# Patient Record
Sex: Male | Born: 1985 | Hispanic: No | Marital: Single | Smoking: Never smoker
Health system: Southern US, Community
[De-identification: ages and names within clinical notes are randomized; demographics above are authoritative.]

## PROBLEM LIST (undated history)

## (undated) DIAGNOSIS — T79A0XA Compartment syndrome, unspecified, initial encounter: Secondary | ICD-10-CM

## (undated) DIAGNOSIS — G4733 Obstructive sleep apnea (adult) (pediatric): Secondary | ICD-10-CM

## (undated) DIAGNOSIS — F4310 Post-traumatic stress disorder, unspecified: Secondary | ICD-10-CM

## (undated) HISTORY — PX: NO PAST SURGERIES: SHX2092

## (undated) HISTORY — DX: Post-traumatic stress disorder, unspecified: F43.10

## (undated) HISTORY — DX: Obstructive sleep apnea (adult) (pediatric): G47.33

## (undated) HISTORY — DX: Compartment syndrome, unspecified, initial encounter: T79.A0XA

---

## 2004-10-07 ENCOUNTER — Emergency Department (HOSPITAL_COMMUNITY): Admission: EM | Admit: 2004-10-07 | Discharge: 2004-10-07 | Payer: Self-pay | Admitting: Family Medicine

## 2011-01-19 ENCOUNTER — Other Ambulatory Visit (HOSPITAL_COMMUNITY): Payer: Self-pay | Admitting: Orthopedic Surgery

## 2011-01-19 DIAGNOSIS — R52 Pain, unspecified: Secondary | ICD-10-CM

## 2011-01-22 ENCOUNTER — Inpatient Hospital Stay (HOSPITAL_COMMUNITY): Admission: RE | Admit: 2011-01-22 | Payer: Self-pay | Source: Ambulatory Visit

## 2011-01-22 ENCOUNTER — Ambulatory Visit (HOSPITAL_COMMUNITY)
Admission: RE | Admit: 2011-01-22 | Discharge: 2011-01-22 | Disposition: A | Discharge status: Home / Self Care | Source: Ambulatory Visit | Attending: Orthopedic Surgery | Admitting: Orthopedic Surgery

## 2011-01-22 ENCOUNTER — Other Ambulatory Visit (HOSPITAL_COMMUNITY): Payer: Self-pay | Admitting: Orthopedic Surgery

## 2011-01-22 DIAGNOSIS — M79609 Pain in unspecified limb: Secondary | ICD-10-CM | POA: Insufficient documentation

## 2011-01-22 DIAGNOSIS — R52 Pain, unspecified: Secondary | ICD-10-CM

## 2011-01-22 DIAGNOSIS — IMO0002 Reserved for concepts with insufficient information to code with codable children: Secondary | ICD-10-CM

## 2013-05-03 ENCOUNTER — Emergency Department (HOSPITAL_COMMUNITY)
Admission: EM | Admit: 2013-05-03 | Discharge: 2013-05-04 | Disposition: A | Discharge status: Home / Self Care | Payer: BC Managed Care – PPO | Attending: Emergency Medicine | Admitting: Emergency Medicine

## 2013-05-03 ENCOUNTER — Encounter (HOSPITAL_COMMUNITY): Payer: Self-pay | Admitting: *Deleted

## 2013-05-03 DIAGNOSIS — R112 Nausea with vomiting, unspecified: Secondary | ICD-10-CM | POA: Insufficient documentation

## 2013-05-03 DIAGNOSIS — R51 Headache: Secondary | ICD-10-CM | POA: Insufficient documentation

## 2013-05-03 DIAGNOSIS — Y939 Activity, unspecified: Secondary | ICD-10-CM | POA: Insufficient documentation

## 2013-05-03 DIAGNOSIS — Y929 Unspecified place or not applicable: Secondary | ICD-10-CM | POA: Insufficient documentation

## 2013-05-03 DIAGNOSIS — X30XXXA Exposure to excessive natural heat, initial encounter: Secondary | ICD-10-CM | POA: Insufficient documentation

## 2013-05-03 DIAGNOSIS — T679XXA Effect of heat and light, unspecified, initial encounter: Secondary | ICD-10-CM | POA: Insufficient documentation

## 2013-05-03 DIAGNOSIS — R509 Fever, unspecified: Secondary | ICD-10-CM | POA: Insufficient documentation

## 2013-05-03 NOTE — ED Notes (Signed)
Pt c/o headache, n/v, and fever. 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 Pt concerned about heat exposure. Fever has fluctuated today.

## 2013-05-04 ENCOUNTER — Emergency Department (HOSPITAL_COMMUNITY): Payer: BC Managed Care – PPO

## 2013-05-04 LAB — CBC WITH DIFFERENTIAL/PLATELET
Eosinophils Absolute: 0 10*3/uL (ref 0.0–0.7)
Eosinophils Relative: 0 % (ref 0–5)
Lymphs Abs: 0.5 10*3/uL — ABNORMAL LOW (ref 0.7–4.0)
MCH: 30.6 pg (ref 26.0–34.0)
MCV: 89.1 fL (ref 78.0–100.0)
Monocytes Absolute: 1 10*3/uL (ref 0.1–1.0)
Platelets: 294 10*3/uL (ref 150–400)
RBC: 4.68 MIL/uL (ref 4.22–5.81)
RDW: 12.8 % (ref 11.5–15.5)

## 2013-05-04 LAB — BASIC METABOLIC PANEL
Calcium: 9.7 mg/dL (ref 8.4–10.5)
Creatinine, Ser: 0.93 mg/dL (ref 0.50–1.35)
GFR calc non Af Amer: >90 mL/min (ref >90)
Glucose, Bld: 127 mg/dL — ABNORMAL HIGH (ref 70–99)
Sodium: 132 mEq/L — ABNORMAL LOW (ref 135–145)

## 2013-05-04 MED ORDER — MORPHINE SULFATE 4 MG/ML IJ SOLN
2.0000 mg | Freq: Once | INTRAMUSCULAR | Status: AC
  Administered 2013-05-04: 2 mg via INTRAVENOUS
  Filled 2013-05-04: qty 1

## 2013-05-04 MED ORDER — SODIUM CHLORIDE 0.9 % IV BOLUS (SEPSIS)
1000.0000 mL | Freq: Once | INTRAVENOUS | Status: AC
  Administered 2013-05-04: 1000 mL via INTRAVENOUS

## 2013-05-04 MED ORDER — ONDANSETRON HCL 4 MG/2ML IJ SOLN
4.0000 mg | Freq: Once | INTRAMUSCULAR | Status: AC
  Administered 2013-05-04: 4 mg via INTRAVENOUS
  Filled 2013-05-04: qty 2

## 2013-05-04 MED ORDER — KETOROLAC TROMETHAMINE 30 MG/ML IJ SOLN
30.0000 mg | Freq: Once | INTRAMUSCULAR | Status: AC
  Administered 2013-05-04: 30 mg via INTRAVENOUS
  Filled 2013-05-04: qty 1

## 2013-05-04 MED ORDER — SODIUM CHLORIDE 0.9 % IV BOLUS (SEPSIS): 1000.0000 mL | Freq: Once | INTRAVENOUS | Status: DC

## 2013-05-04 MED ORDER — SODIUM CHLORIDE 0.9 % IV SOLN
Freq: Once | INTRAVENOUS | Status: AC
  Administered 2013-05-04: via INTRAVENOUS

## 2013-05-04 NOTE — ED Provider Notes (Signed)
History    CSN: [id number] Arrival date & time 05/03/13  2253  First MD Initiated Contact with Patient 05/04/13 0000     Chief Complaint  Patient presents with  . Fever  . Emesis  . Headache  . Heat Exposure   (Consider location/radiation/quality/duration/timing/severity/associated sxs/prior Treatment) HPI HPI Comments: Joshua Pope is a 27 y.o. male who presents to the Emergency Department complaining of headache, nausea, vomiting and a fever. He is in the [employer] and has been outside in uniform working in the heat. Denies chills, shortness of breath, cough. He has taken no pills today.   PCP Dr. Gerda Diss   History reviewed. No pertinent past medical history. History reviewed. No pertinent past surgical history. History reviewed. No pertinent family history. History  Substance Use Topics  . Smoking status: Never Smoker   . Smokeless tobacco: Not on file  . Alcohol Use: No    Review of Systems  Constitutional: Positive for fever.       10 Systems reviewed and are negative for acute change except as noted in the HPI.  HENT: Negative for congestion.   Eyes: Negative for discharge and redness.  Respiratory: Negative for cough and shortness of breath.   Cardiovascular: Negative for chest pain.  Gastrointestinal: Positive for nausea and vomiting. Negative for abdominal pain.  Musculoskeletal: Negative for back pain.  Skin: Negative for rash.  Neurological: Positive for headaches. Negative for syncope and numbness.  Psychiatric/Behavioral:       No behavior change.    Allergies  Review of patient's allergies indicates no known allergies.  Home Medications  No current outpatient prescriptions on file. BP 131/68  Pulse 116  Temp(Src) 102.7 F (39.3 C) (Oral)  Resp 24  Ht 5\' 10"  (1.778 m)  Wt 270 lb (122.471 kg)  BMI 38.74 kg/m2  SpO2 98% Physical Exam  Nursing note and vitals reviewed. Constitutional: He appears well-developed and well-nourished.  Awake, alert,  nontoxic appearance.  HENT:  Head: Normocephalic and atraumatic.  Eyes: EOM are normal. Pupils are equal, round, and reactive to light. Right eye exhibits no discharge. Left eye exhibits no discharge.  Neck: Neck supple.  Cardiovascular:  tachycardia  Pulmonary/Chest: Effort normal. He exhibits no tenderness.  Abdominal: Soft. There is no tenderness. There is no rebound.  Musculoskeletal: He exhibits no tenderness.  Baseline ROM, no obvious new focal weakness.  Neurological:  Mental status and motor strength appears baseline for patient and situation.  Skin: No rash noted.  Psychiatric: He has a normal mood and affect.    ED Course  Procedures (including critical care time) Results for orders placed during the hospital encounter of 05/03/13  CBC WITH DIFFERENTIAL      Result Value Range   WBC 13.5 (*) 4.0 - 10.5 K/uL   RBC 4.68  4.22 - 5.81 MIL/uL   Hemoglobin 14.3  13.0 - 17.0 g/dL   HCT 01.0  27.2 - 53.6 %   MCV 89.1  78.0 - 100.0 fL   MCH 30.6  26.0 - 34.0 pg   MCHC 34.3  30.0 - 36.0 g/dL   RDW 64.4  03.4 - 74.2 %   Platelets 294  150 - 400 K/uL   Neutrophils Relative % 88 (*) 43 - 77 %   Neutro Abs 11.9 (*) 1.7 - 7.7 K/uL   Lymphocytes Relative 4 (*) 12 - 46 %   Lymphs Abs 0.5 (*) 0.7 - 4.0 K/uL   Monocytes Relative 8  3 - 12 %  Monocytes Absolute 1.0  0.1 - 1.0 K/uL   Eosinophils Relative 0  0 - 5 %   Eosinophils Absolute 0.0  0.0 - 0.7 K/uL   Basophils Relative 0  0 - 1 %   Basophils Absolute 0.0  0.0 - 0.1 K/uL  BASIC METABOLIC PANEL      Result Value Range   Sodium 132 (*) 135 - 145 mEq/L   Potassium 3.3 (*) 3.5 - 5.1 mEq/L   Chloride 95 (*) 96 - 112 mEq/L   CO2 26  19 - 32 mEq/L   Glucose, Bld 127 (*) 70 - 99 mg/dL   BUN 10  6 - 23 mg/dL   Creatinine, Ser 1.61  0.50 - 1.35 mg/dL   Calcium 9.7  8.4 - 09.6 mg/dL   GFR calc non Af Amer >90  >90 mL/min   GFR calc Af Amer >90  >90 mL/min  CK      Result Value Range   Total CK 189  7 - 232 U/L   Dg Chest 2  View  05/04/2013   *RADIOLOGY REPORT*  Clinical Data: Headache, nausea, vomiting and fever.  CHEST - 2 VIEW  Comparison: None.  Findings: The lungs are well-aerated.  Pulmonary vascularity is at the upper limits of normal.  There is no evidence of focal opacification, pleural effusion or pneumothorax.  The heart is normal in size; the mediastinal contour is within normal limits.  No acute osseous abnormalities are seen.  IMPRESSION: No acute cardiopulmonary process seen.   Original Report Authenticated By: Tonia Ghent, M.D.   Medications  sodium chloride 0.9 % bolus 1,000 mL (not administered)  0.9 %  sodium chloride infusion ( Intravenous Stopped 05/04/13 0522)  sodium chloride 0.9 % bolus 1,000 mL (0 mLs Intravenous Stopped 05/04/13 0117)  ketorolac (TORADOL) 30 MG/ML injection 30 mg (30 mg Intravenous Given 05/04/13 0116)  ondansetron (ZOFRAN) injection 4 mg (4 mg Intravenous Given 05/04/13 0116)  sodium chloride 0.9 % bolus 1,000 mL (0 mLs Intravenous Stopped 05/04/13 0333)  morphine 4 MG/ML injection 2 mg (2 mg Intravenous Given 05/04/13 0210)     0534 Patient has received three liters of IVF. He has gone to the bathroom x 2. HR has come down from 116 to 101. Temperature has come down to 99. He feels better.  MDM  Patient with fever to 102.7 and feeling tired dehydrated. He had been working outside in full uniform. Given IVF x 3. Patient has improved over time. Pt stable in ED with no significant deterioration in condition.The patient appears reasonably screened and/or stabilized for discharge and I doubt any other medical condition or other [facility] requiring further screening, evaluation, or treatment in the ED at this time prior to discharge.  MDM Reviewed: nursing note and vitals Interpretation: labs and x-ray     Nicoletta Dress. Colon Branch, MD 05/04/13 [phone]

## 2013-05-06 ENCOUNTER — Ambulatory Visit (INDEPENDENT_AMBULATORY_CARE_PROVIDER_SITE_OTHER): Payer: BC Managed Care – PPO | Admitting: Family Medicine

## 2013-05-06 ENCOUNTER — Encounter: Payer: Self-pay | Admitting: Family Medicine

## 2013-05-06 VITALS — BP 122/82 | Temp 98.4°F | Wt 292.0 lb

## 2013-05-06 DIAGNOSIS — J039 Acute tonsillitis, unspecified: Secondary | ICD-10-CM

## 2013-05-06 MED ORDER — AZITHROMYCIN 250 MG PO TABS: ORAL_TABLET | ORAL | Status: DC

## 2013-05-06 NOTE — Progress Notes (Signed)
  Subjective:    Patient ID: Elber Galyean, male    DOB: 07/21/1986, 27 y.o.   MRN: [id number]  Fever    All day sat and sun in the heat and humidity. Started running fever.  Not sweating as well.   Tried to hydrate, developed fever and vominting. Ended u in er with dx of heat exhaustion.  Seen in ER   Review of Systems  Constitutional: Positive for fever.   no chest pain no abdominal pain fair appetite mild headache ROS otherwise negative     Objective:   Physical Exam  Alert no acute distress. Lungs clear. Heart regular rate and rhythm. H&T pharynx erythematous exam date of tonsillitis tender anterior nodes.      Assessment & Plan:  Impression exudative tonsillitis with elements of heat exhaustion. Plan Z-Pak. Symptomatic care discussed. Avoiding dehydration discussed. I really think most of patient's symptoms were his incipient sickness rather than heat exhaustion. WSL

## 2013-07-17 ENCOUNTER — Other Ambulatory Visit (HOSPITAL_COMMUNITY)

## 2013-07-17 ENCOUNTER — Emergency Department (HOSPITAL_COMMUNITY): Payer: Worker's Compensation

## 2013-07-17 ENCOUNTER — Encounter (HOSPITAL_COMMUNITY): Payer: Self-pay

## 2013-07-17 ENCOUNTER — Emergency Department (HOSPITAL_COMMUNITY)
Admission: EM | Admit: 2013-07-17 | Discharge: 2013-07-17 | Disposition: A | Discharge status: Home / Self Care | Payer: Worker's Compensation | Attending: Emergency Medicine | Admitting: Emergency Medicine

## 2013-07-17 DIAGNOSIS — Y990 Civilian activity done for income or pay: Secondary | ICD-10-CM | POA: Insufficient documentation

## 2013-07-17 DIAGNOSIS — IMO0002 Reserved for concepts with insufficient information to code with codable children: Secondary | ICD-10-CM | POA: Insufficient documentation

## 2013-07-17 DIAGNOSIS — S060X1A Concussion with loss of consciousness of 30 minutes or less, initial encounter: Secondary | ICD-10-CM | POA: Insufficient documentation

## 2013-07-17 DIAGNOSIS — Y9389 Activity, other specified: Secondary | ICD-10-CM | POA: Insufficient documentation

## 2013-07-17 DIAGNOSIS — Y9289 Other specified places as the place of occurrence of the external cause: Secondary | ICD-10-CM | POA: Insufficient documentation

## 2013-07-17 DIAGNOSIS — S060X9A Concussion with loss of consciousness of unspecified duration, initial encounter: Secondary | ICD-10-CM

## 2013-07-17 MED ORDER — IBUPROFEN 800 MG PO TABS: 800.0000 mg | ORAL_TABLET | Freq: Three times a day (TID) | ORAL | Status: DC | PRN

## 2013-07-17 MED ORDER — IBUPROFEN 800 MG PO TABS
800.0000 mg | ORAL_TABLET | Freq: Once | ORAL | Status: AC
  Administered 2013-07-17: 800 mg via ORAL
  Filled 2013-07-17: qty 1

## 2013-07-17 NOTE — ED Provider Notes (Signed)
CSN: [id number]     Arrival date & time 07/17/13  1052 History   First MD Initiated Contact with Patient 07/17/13 1101     Chief Complaint  Patient presents with  . Head Injury   (Consider location/radiation/quality/duration/timing/severity/associated sxs/prior Treatment) HPI Comments: Patient here from working at [employer] when he was helping unload a piece of heavy equipment that weighted 2500 pounds when it became off balance and stuck him on the left parietal side of the head.  + LOC for approximately 2-3 minutes.  Reports now with headache and scalp tenderness to the area.  Denies nausea, vomiting, blurred vision, seizure, neck or back pain, dizziness.  Patient with abrasions to the scalp at the location without frank laceration.  Patient is a 27 y.o. male presenting with head injury. The history is provided by the patient. No language interpreter was used.  Head Injury Location:  L parietal Time since incident:  1 hour Mechanism of injury: direct blow and work   Pain details:    Quality:  Throbbing   Severity:  Moderate   Duration:  1 hour   Timing:  Constant   Progression:  Unchanged Chronicity:  New Relieved by:  Nothing Worsened by:  Nothing tried Ineffective treatments:  None tried Associated symptoms: headache and loss of consciousness   Associated symptoms: no blurred vision, no disorientation, no double vision, no focal weakness, no hearing loss, no memory loss, no nausea, no neck pain, no numbness, no seizures and no vomiting     History reviewed. No pertinent past medical history. History reviewed. No pertinent past surgical history. No family history on file. History  Substance Use Topics  . Smoking status: Never Smoker   . Smokeless tobacco: Never Used  . Alcohol Use: No    Review of Systems  HENT: Negative for hearing loss and neck pain.   Eyes: Negative for blurred vision and double vision.  Gastrointestinal: Negative for nausea and  vomiting.  Neurological: Positive for loss of consciousness and headaches. Negative for focal weakness, seizures and numbness.  Psychiatric/Behavioral: Negative for memory loss.  All other systems reviewed and are negative.    Allergies  Review of patient's allergies indicates no known allergies.  Home Medications  No current outpatient prescriptions on file. BP 124/76  Pulse 82  Temp(Src) 98.7 F (37.1 C) (Oral)  Resp 16  Wt 288 lb 5.8 oz (130.8 kg)  BMI 41.38 kg/m2  SpO2 99% Physical Exam  Nursing note and vitals reviewed. Constitutional: He is oriented to person, place, and time. He appears well-developed and well-nourished. No distress.  HENT:  Head: Normocephalic. Head is without raccoon's eyes, without Battle's sign and without laceration.    Right Ear: External ear normal. No hemotympanum.  Left Ear: External ear normal. No hemotympanum.  Nose: Nose normal.  Mouth/Throat: Oropharynx is clear and moist. No oropharyngeal exudate.  Eyes: Conjunctivae and EOM are normal. Pupils are equal, round, and reactive to light. No scleral icterus.  No facial tenderness  Neck: Neck supple. No spinous process tenderness and no muscular tenderness present.  Cardiovascular: Normal rate, regular rhythm and normal heart sounds.  Exam reveals no gallop and no friction rub.   No murmur heard. Pulmonary/Chest: Effort normal and breath sounds normal. No respiratory distress. He has no wheezes. He has no rales. He exhibits no tenderness.  Abdominal: Soft. Bowel sounds are normal. He exhibits no distension and no mass. There is no tenderness. There is no rebound and no guarding.  Musculoskeletal:  Normal range of motion. He exhibits no edema and no tenderness.  Neurological: He is alert and oriented to person, place, and time. No cranial nerve deficit. He exhibits normal muscle tone. Coordination normal.  Skin: Skin is warm and dry. No rash noted. No erythema. No pallor.  Psychiatric: He has a  normal mood and affect. His behavior is normal. Judgment and thought content normal.    ED Course  Procedures (including critical care time) Labs Review Labs Reviewed - No data to display Imaging Review Ct Head Wo Contrast  07/17/2013   CLINICAL DATA:  The patient was involved any in industrial accent. He was struck in the left parietal region and knocked to the ground. The patient lost consciousness.  EXAM: CT HEAD WITHOUT CONTRAST  CT CERVICAL SPINE WITHOUT CONTRAST  TECHNIQUE: Multidetector CT imaging of the head and cervical spine was performed following the standard protocol without intravenous contrast. Multiplanar CT image reconstructions of the cervical spine were also generated.  COMPARISON:  None.  FINDINGS: CT HEAD FINDINGS  No acute cortical infarct, hemorrhage, or mass lesion is present toes. Ventricles are of normal size. No significant extra-axial fluid collection is present.  A polyp or mucous retention cyst is evident in the left maxillary sinus. The paranasal sinuses and mastoid air cells are clear. The osseous skull is intact. No significant extracranial soft tissue injury is evident.  CT CERVICAL SPINE FINDINGS  The cervical spine is visualized from the skullbase through T1-2. There is loss of bone detail at C7-T1 secondary to patient body habitus. Vertebral body heights and alignment are maintained. There is calcification in the anterior disk at C5-6, a normal variant. No acute fracture or traumatic subluxation is evident. There straightening of the normal cervical lordosis, likely positional as the patient is in a hard collar.  IMPRESSION: CT HEAD IMPRESSION  1. Negative CT of the head.  CT CERVICAL SPINE IMPRESSION  1. No acute fracture or traumatic subluxation 2. Straightening and slight reversal of the normal cervical lordosis is likely positional as the patient is in a hard collar.   Electronically Signed   By: Gennette Pac   On: 07/17/2013 12:07   Ct Cervical Spine Wo  Contrast  07/17/2013   CLINICAL DATA:  The patient was involved any in industrial accent. He was struck in the left parietal region and knocked to the ground. The patient lost consciousness.  EXAM: CT HEAD WITHOUT CONTRAST  CT CERVICAL SPINE WITHOUT CONTRAST  TECHNIQUE: Multidetector CT imaging of the head and cervical spine was performed following the standard protocol without intravenous contrast. Multiplanar CT image reconstructions of the cervical spine were also generated.  COMPARISON:  None.  FINDINGS: CT HEAD FINDINGS  No acute cortical infarct, hemorrhage, or mass lesion is present toes. Ventricles are of normal size. No significant extra-axial fluid collection is present.  A polyp or mucous retention cyst is evident in the left maxillary sinus. The paranasal sinuses and mastoid air cells are clear. The osseous skull is intact. No significant extracranial soft tissue injury is evident.  CT CERVICAL SPINE FINDINGS  The cervical spine is visualized from the skullbase through T1-2. There is loss of bone detail at C7-T1 secondary to patient body habitus. Vertebral body heights and alignment are maintained. There is calcification in the anterior disk at C5-6, a normal variant. No acute fracture or traumatic subluxation is evident. There straightening of the normal cervical lordosis, likely positional as the patient is in a hard collar.  IMPRESSION: CT HEAD  IMPRESSION  1. Negative CT of the head.  CT CERVICAL SPINE IMPRESSION  1. No acute fracture or traumatic subluxation 2. Straightening and slight reversal of the normal cervical lordosis is likely positional as the patient is in a hard collar.   Electronically Signed   By: Gennette Pac   On: 07/17/2013 12:07    MDM  Concussion with brief LOC  Patient here with head injury with + LOC, no other injuries noted.  Stable while in ER.  Remembers entire event besides during the LOC.  No laceration.  Stable for discharge and home.   Izola Price Marisue Humble,  PA-C 07/17/13 1241

## 2013-07-17 NOTE — ED Notes (Signed)
Patient denies any neck or back pain. Logroll of LSB by nursing staff. No cervical, thoracic, lumbar or sacral tenderness. Head of bed raised for patient comfort. C-collar remains in place.

## 2013-07-17 NOTE — ED Notes (Signed)
Patient was a work at [employer] via [employer]. 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 Patient knocked to ground, positive LOC.

## 2013-07-19 NOTE — ED Provider Notes (Signed)
History/physical exam/procedure(s) were performed by non-physician practitioner and as supervising physician I was immediately available for consultation/collaboration. I have reviewed all notes and am in agreement with care and plan.   Leightyn Cina S Demian Maisel, MD 07/19/13 1625 

## 2013-09-05 ENCOUNTER — Encounter (HOSPITAL_COMMUNITY): Payer: Self-pay | Admitting: Emergency Medicine

## 2013-09-05 ENCOUNTER — Emergency Department (HOSPITAL_COMMUNITY): Payer: BC Managed Care – PPO

## 2013-09-05 ENCOUNTER — Emergency Department (HOSPITAL_COMMUNITY)
Admission: EM | Admit: 2013-09-05 | Discharge: 2013-09-05 | Disposition: A | Discharge status: Home / Self Care | Payer: BC Managed Care – PPO | Attending: Emergency Medicine | Admitting: Emergency Medicine

## 2013-09-05 DIAGNOSIS — M545 Low back pain, unspecified: Secondary | ICD-10-CM | POA: Insufficient documentation

## 2013-09-05 DIAGNOSIS — Z792 Long term (current) use of antibiotics: Secondary | ICD-10-CM | POA: Insufficient documentation

## 2013-09-05 DIAGNOSIS — J40 Bronchitis, not specified as acute or chronic: Secondary | ICD-10-CM | POA: Insufficient documentation

## 2013-09-05 DIAGNOSIS — K5900 Constipation, unspecified: Secondary | ICD-10-CM

## 2013-09-05 DIAGNOSIS — M549 Dorsalgia, unspecified: Secondary | ICD-10-CM

## 2013-09-05 DIAGNOSIS — Z8619 Personal history of other infectious and parasitic diseases: Secondary | ICD-10-CM | POA: Insufficient documentation

## 2013-09-05 LAB — URINALYSIS, ROUTINE W REFLEX MICROSCOPIC
Bilirubin Urine: NEGATIVE
Glucose, UA: NEGATIVE mg/dL
Hgb urine dipstick: NEGATIVE
Ketones, ur: NEGATIVE mg/dL
Nitrite: NEGATIVE
pH: 6 (ref 5.0–8.0)

## 2013-09-05 MED ORDER — DEXAMETHASONE 6 MG PO TABS: ORAL_TABLET | ORAL | Status: DC

## 2013-09-05 MED ORDER — MAGNESIUM HYDROXIDE 400 MG/5ML PO SUSP
30.0000 mL | Freq: Once | ORAL | Status: AC
  Administered 2013-09-05: 30 mL via ORAL
  Filled 2013-09-05: qty 30

## 2013-09-05 MED ORDER — DICLOFENAC SODIUM 75 MG PO TBEC: 75.0000 mg | DELAYED_RELEASE_TABLET | Freq: Two times a day (BID) | ORAL | Status: DC

## 2013-09-05 MED ORDER — METHOCARBAMOL 500 MG PO TABS: 500.0000 mg | ORAL_TABLET | Freq: Three times a day (TID) | ORAL | Status: DC

## 2013-09-05 MED ORDER — HYDROCODONE-ACETAMINOPHEN 5-325 MG PO TABS
2.0000 | ORAL_TABLET | Freq: Once | ORAL | Status: AC
  Administered 2013-09-05: 2 via ORAL
  Filled 2013-09-05: qty 2

## 2013-09-05 MED ORDER — ONDANSETRON HCL 4 MG PO TABS
4.0000 mg | ORAL_TABLET | Freq: Once | ORAL | Status: AC
  Administered 2013-09-05: 4 mg via ORAL
  Filled 2013-09-05: qty 1

## 2013-09-05 NOTE — ED Notes (Signed)
Pain in right lower back, worse with movement, nauseated yesterday, diagnosed with strep last week

## 2013-09-05 NOTE — ED Provider Notes (Signed)
CSN: [id number]     Arrival date & time 09/05/13  1704 History   First MD Initiated Contact with Patient 09/05/13 1816     Chief Complaint  Patient presents with  . Back Pain   (Consider location/radiation/quality/duration/timing/severity/associated sxs/prior Treatment) Patient is a 27 y.o. male presenting with back pain. The history is provided by the patient.  Back Pain Location:  Lumbar spine Quality:  Aching Radiates to:  Does not radiate Pain severity:  Moderate Pain is:  Same all the time Onset quality:  Gradual Duration:  1 day Timing:  Constant Progression:  Worsening Chronicity:  New Context comment:  Coughing a lot Relieved by:  Nothing Worsened by:  Movement and deep breathing Ineffective treatments:  None tried Associated symptoms: no abdominal pain, no bladder incontinence, no bowel incontinence, no chest pain, no dysuria, no numbness and no perianal numbness   Risk factors comment:  Treated for strep over 1 week ago. Multiple family members have been sick iwth uri   History reviewed. No pertinent past medical history. History reviewed. No pertinent past surgical history. No family history on file. History  Substance Use Topics  . Smoking status: Never Smoker   . Smokeless tobacco: Never Used  . Alcohol Use: No    Review of Systems  Constitutional: Negative for activity change.       All ROS Neg except as noted in HPI  HENT: Negative for nosebleeds.   Eyes: Negative for photophobia and discharge.  Respiratory: Negative for cough, shortness of breath and wheezing.   Cardiovascular: Negative for chest pain and palpitations.  Gastrointestinal: Negative for abdominal pain, blood in stool and bowel incontinence.  Genitourinary: Negative for bladder incontinence, dysuria, frequency and hematuria.  Musculoskeletal: Positive for back pain. Negative for arthralgias and neck pain.  Skin: Negative.   Neurological: Negative for dizziness, seizures, speech difficulty  and numbness.  Psychiatric/Behavioral: Negative for hallucinations and confusion.    Allergies  Review of patient's allergies indicates no known allergies.  Home Medications   Current Outpatient Rx  Name  Route  Sig  Dispense  Refill  . amoxicillin (AMOXIL) 875 MG tablet   Oral   Take 875 mg by mouth 2 (two) times daily. 10 DAY COURSE STARTING ON 08/21/2013          BP 141/90  Pulse 102  Ht 5\' 10"  (1.778 m)  Wt 270 lb (122.471 kg)  BMI 38.74 kg/m2  SpO2 100% Physical Exam  Nursing note and vitals reviewed. Constitutional: He is oriented to person, place, and time. He appears well-developed and well-nourished.  Non-toxic appearance.  HENT:  Head: Normocephalic.  Right Ear: Tympanic membrane and external ear normal.  Left Ear: Tympanic membrane and external ear normal.  Eyes: EOM and lids are normal. Pupils are equal, round, and reactive to light.  Neck: Normal range of motion. Neck supple. Carotid bruit is not present.  Cardiovascular: Normal rate, regular rhythm, normal heart sounds, intact distal pulses and normal pulses.   Pulmonary/Chest: Breath sounds normal. No respiratory distress.  Abdominal: Soft. Bowel sounds are normal. There is no tenderness. There is no guarding.  No CVA tenderness.  Musculoskeletal:  There is pain with attempted range of motion of the lower lumbar area. There is soreness at the paraspinous area of the lumbar region. There no hot areas appreciated. No palpable step off.  Lymphadenopathy:       Head (right side): No submandibular adenopathy present.       Head (left side): No submandibular  adenopathy present.    He has no cervical adenopathy.  Neurological: He is alert and oriented to person, place, and time. He has normal strength. No cranial nerve deficit or sensory deficit.  Skin: Skin is warm and dry.  Psychiatric: He has a normal mood and affect. His speech is normal.    ED Course  Procedures (including critical care time) Labs  Review Labs Reviewed  ANTISTREPTOLYSIN O TITER  URINALYSIS, ROUTINE W REFLEX MICROSCOPIC   Imaging Review No results found.  EKG Interpretation   None       MDM  No diagnosis found. **I have reviewed nursing notes, vital signs, and all appropriate lab and imaging results for this patient.* Urinalysis is negative for urinary tract infection or evidence of kidney stone. Chest x-ray shows mild bronchitic changes without acute cardiopulmonary disease. Lumbar spine film shows no acute finding. There is colonic stool burden with mild to moderate gaseous distention of the small bowel region.  Patient recently treated for strep, but did not finish his antibiotics as prescribed. A SO titer has been obtained. The plan at this time is for the patient to increase bran and leafy green vegetables and fluids. Patient advised to use stool softener if needed. We'll also treat with diclofenac and Robaxin and prednisone.  Kathie Dike, PA-C 09/05/13 2143  Kathie Dike, PA-C 09/05/13 [phone]

## 2013-09-06 NOTE — ED Provider Notes (Signed)
Medical screening examination/treatment/procedure(s) were performed by non-physician practitioner and as supervising physician I was immediately available for consultation/collaboration.  EKG Interpretation   None         Laray Anger, DO 09/06/13 7829

## 2013-09-09 LAB — ANTISTREPTOLYSIN O TITER: ASO: 1570 IU/mL — ABNORMAL HIGH (ref <409)

## 2014-01-15 IMAGING — CR DG CHEST 2V
2 series · 2 of 2 positions shown · non-contrast
Comparison: 05/04/2013

CLINICAL DATA: Right lower back pain, worse with movement

EXAM:
CHEST  2 VIEW

[view not recorded (1 of 2)]
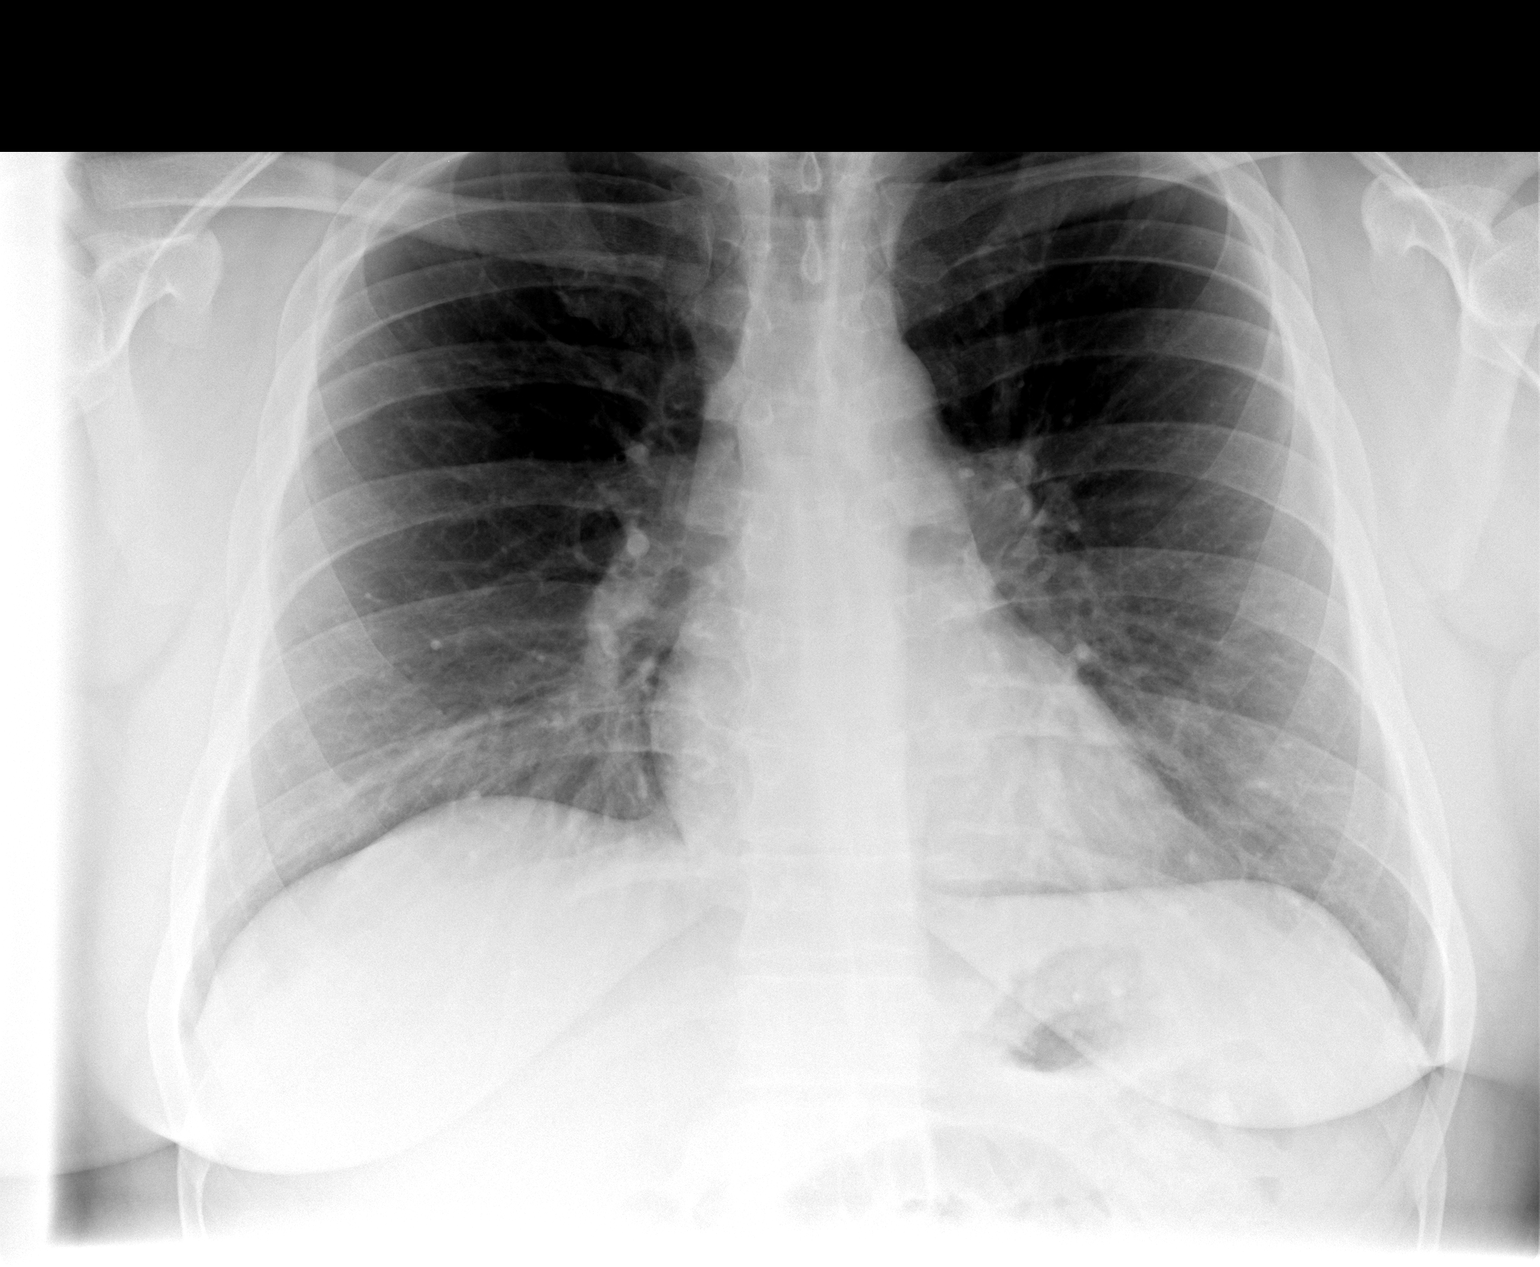

[view not recorded (2 of 2)]
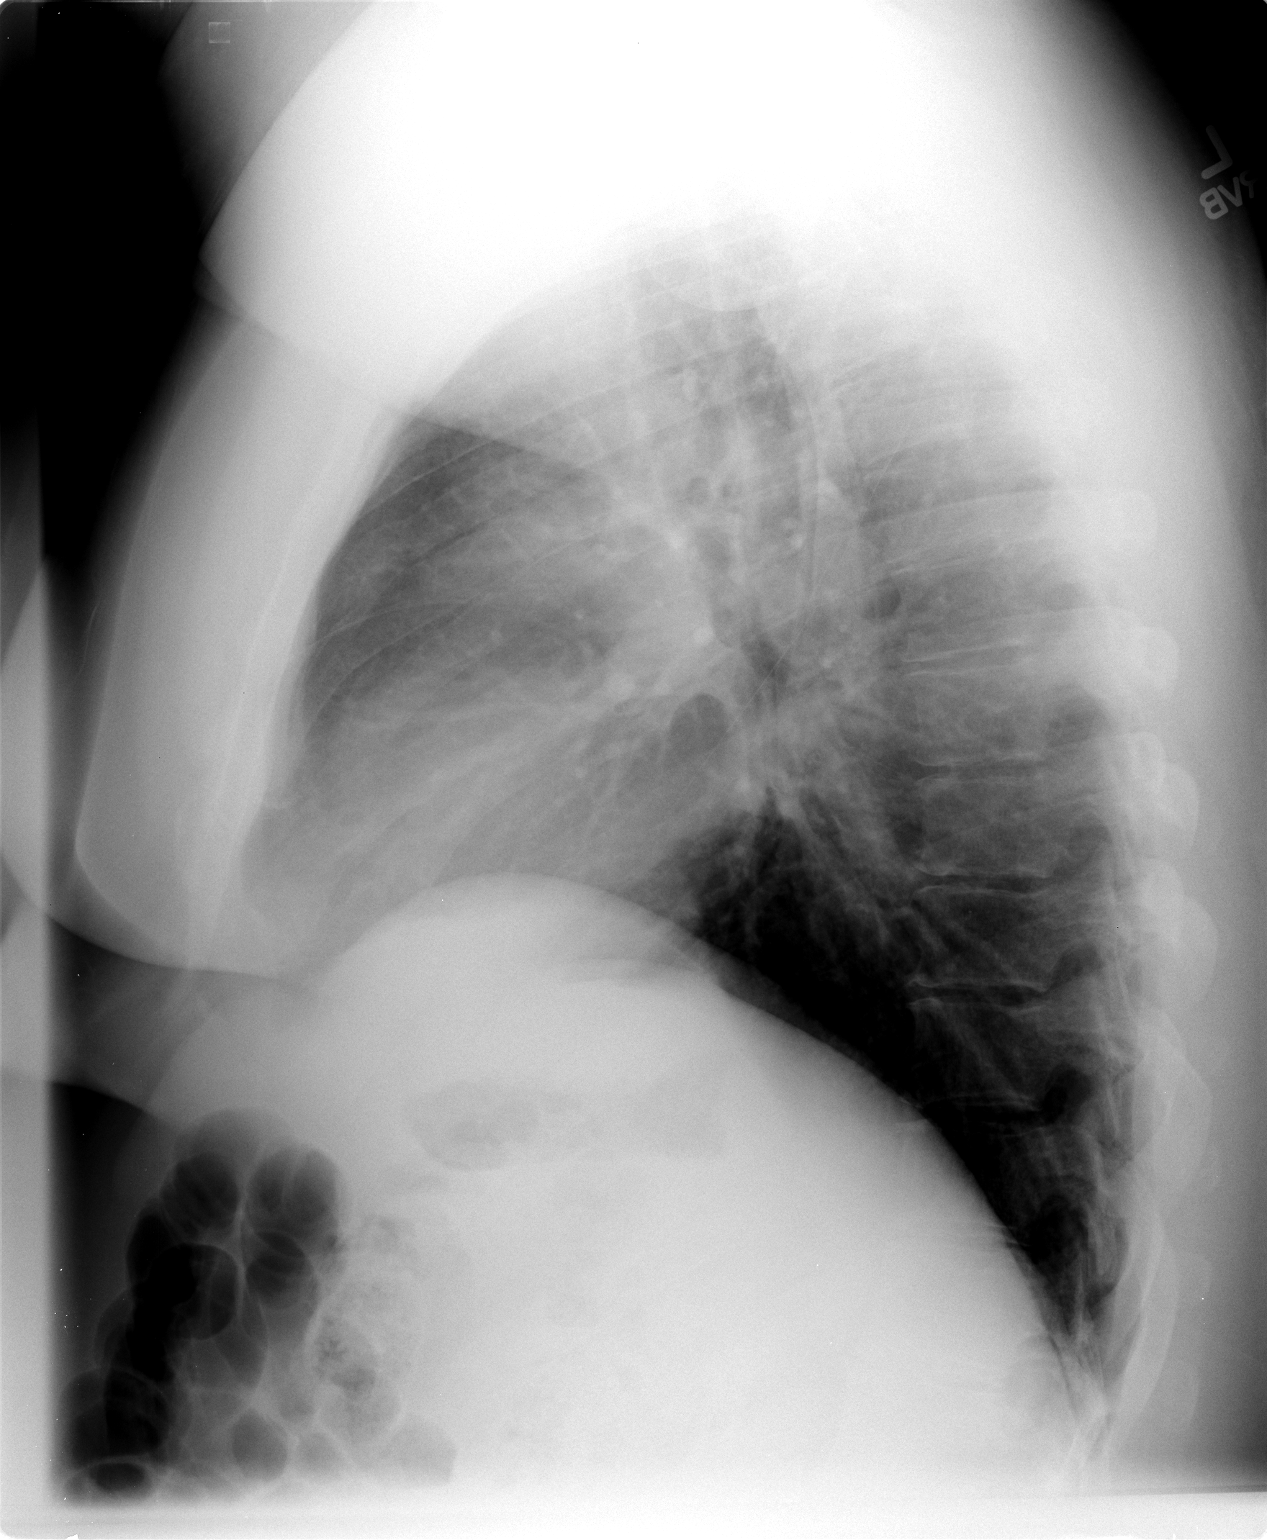

[2 of 2 positions shown; findings below may reference images not displayed]

FINDINGS: Grossly unchanged cardiac silhouette and mediastinal contours. This
grossly unchanged mild thickening of the pulmonary interstitium. No
focal airspace opacities. Mild elevation the right hemidiaphragm. No
pleural effusion or pneumothorax. No evidence of edema.
IMPRESSION: Mild bronchitic change without acute cardiopulmonary disease.

## 2018-10-02 ENCOUNTER — Other Ambulatory Visit: Payer: Self-pay

## 2018-10-02 ENCOUNTER — Emergency Department (HOSPITAL_COMMUNITY): Payer: Worker's Compensation

## 2018-10-02 ENCOUNTER — Emergency Department (HOSPITAL_COMMUNITY)
Admission: EM | Admit: 2018-10-02 | Discharge: 2018-10-02 | Disposition: A | Discharge status: Home / Self Care | Payer: Worker's Compensation | Attending: Emergency Medicine | Admitting: Emergency Medicine

## 2018-10-02 ENCOUNTER — Encounter (HOSPITAL_COMMUNITY): Payer: Self-pay | Admitting: Emergency Medicine

## 2018-10-02 DIAGNOSIS — Z23 Encounter for immunization: Secondary | ICD-10-CM | POA: Diagnosis not present

## 2018-10-02 DIAGNOSIS — W268XXA Contact with other sharp object(s), not elsewhere classified, initial encounter: Secondary | ICD-10-CM | POA: Insufficient documentation

## 2018-10-02 DIAGNOSIS — Y929 Unspecified place or not applicable: Secondary | ICD-10-CM | POA: Diagnosis not present

## 2018-10-02 DIAGNOSIS — Y999 Unspecified external cause status: Secondary | ICD-10-CM | POA: Insufficient documentation

## 2018-10-02 DIAGNOSIS — S61412A Laceration without foreign body of left hand, initial encounter: Secondary | ICD-10-CM | POA: Diagnosis present

## 2018-10-02 DIAGNOSIS — S61211A Laceration without foreign body of left index finger without damage to nail, initial encounter: Secondary | ICD-10-CM | POA: Insufficient documentation

## 2018-10-02 DIAGNOSIS — Y939 Activity, unspecified: Secondary | ICD-10-CM | POA: Insufficient documentation

## 2018-10-02 MED ORDER — POVIDONE-IODINE 10 % EX SOLN
CUTANEOUS | Status: AC
  Administered 2018-10-02: 17:00:00
  Filled 2018-10-02: qty 15

## 2018-10-02 MED ORDER — TETANUS-DIPHTH-ACELL PERTUSSIS 5-2.5-18.5 LF-MCG/0.5 IM SUSP
0.5000 mL | Freq: Once | INTRAMUSCULAR | Status: AC
  Administered 2018-10-02: 0.5 mL via INTRAMUSCULAR
  Filled 2018-10-02: qty 0.5

## 2018-10-02 MED ORDER — LIDOCAINE HCL (PF) 1 % IJ SOLN
INTRAMUSCULAR | Status: AC
  Administered 2018-10-02: 5 mL
  Filled 2018-10-02: qty 5

## 2018-10-02 NOTE — ED Triage Notes (Signed)
PT states his left hand index finger obtained a laceration with bleeding controlled a this time when a metal motor part fell onto it today at work.

## 2018-10-02 NOTE — ED Provider Notes (Signed)
[facility] EMERGENCY DEPARTMENT Provider Note   CSN: [id number] Arrival date & time: 10/02/18  1557     History   Chief Complaint Chief Complaint  Patient presents with  . Laceration    HPI Joshua Pope is a 32 y.o. male.  The history is provided by the patient.  Laceration   The incident occurred less than 1 hour ago. The laceration is located on the left hand. The laceration is 2 cm in size. The laceration mechanism was a a metal edge. The pain is at a severity of 2/10. The pain is mild. The pain has been constant since onset. He reports no foreign bodies present. His tetanus status is out of date.    History reviewed. No pertinent past medical history.  There are no active problems to display for this patient.   History reviewed. No pertinent surgical history.      Home Medications    Prior to Admission medications   Medication Sig Start Date End Date Taking? Authorizing Provider  amoxicillin (AMOXIL) 875 MG tablet Take 875 mg by mouth 2 (two) times daily. 10 DAY COURSE STARTING ON 08/21/2013    [provider]  dexamethasone (DECADRON) 6 MG tablet 1 po bid  With food 09/05/13   Ivery Quale, PA-C  diclofenac (VOLTAREN) 75 MG EC tablet Take 1 tablet (75 mg total) by mouth 2 (two) times daily. 09/05/13   Ivery Quale, PA-C  methocarbamol (ROBAXIN) 500 MG tablet Take 1 tablet (500 mg total) by mouth 3 (three) times daily. 09/05/13   Ivery Quale, PA-C    Family History History reviewed. No pertinent family history.  Social History Social History   Tobacco Use  . Smoking status: Never Smoker  . Smokeless tobacco: Never Used  Substance Use Topics  . Alcohol use: No  . Drug use: No     Allergies   Patient has no known allergies.   Review of Systems Review of Systems  Constitutional: Negative for chills and fever.  Respiratory: Negative.   Cardiovascular: Negative.   Gastrointestinal: Negative.   Musculoskeletal: Negative.   Skin:  Positive for wound.  Neurological: Negative for weakness and numbness.     Physical Exam Updated Vital Signs BP (!) 136/93 (BP Location: Right Arm)   Pulse 82   Temp 98.3 F (36.8 C) (Oral)   Resp 19   Ht 5\' 9"  (1.753 m)   Wt 131.5 kg   SpO2 99%   BMI 42.83 kg/m   Physical Exam Constitutional:      Appearance: He is well-developed.  HENT:     Head: Normocephalic.  Cardiovascular:     Rate and Rhythm: Normal rate.  Pulmonary:     Effort: Pulmonary effort is normal.  Musculoskeletal:        General: Tenderness present.  Skin:    Findings: Laceration present.  Neurological:     Mental Status: He is alert and oriented to person, place, and time.     Sensory: No sensory deficit.      ED Treatments / Results  Labs (all labs ordered are listed, but only abnormal results are displayed) Labs Reviewed - No data to display  EKG None  Radiology Dg Hand Complete Left  Result Date: 10/02/2018 CLINICAL DATA:  Metal object fell on hand EXAM: LEFT HAND - COMPLETE 3+ VIEW COMPARISON:  None. FINDINGS: Frontal, oblique, and lateral views obtained. No radiopaque foreign body identified beyond ring overlying proximal fourth phalanx. There is no appreciable soft tissue air radiographically.  There is no fracture or dislocation. Joint spaces appear normal. No erosive change. IMPRESSION: No fracture or dislocation. No appreciable arthropathy. No radiopaque foreign body beyond ring overlying fourth proximal digit. Electronically Signed   By: Bretta BangWilliam  Woodruff III M.D.   On: 10/02/2018 17:11    Procedures Procedures (including critical care time)  LACERATION REPAIR Performed by: Burgess AmorJulie Petronella Shuford Authorized by: Burgess AmorJulie Ardella Chhim Consent: Verbal consent obtained. Risks and benefits: risks, benefits and alternatives were discussed Consent given by: patient Patient identity confirmed: provided demographic data Prepped and Draped in normal sterile fashion Wound explored  Laceration Location: left  volar index finger across the dip joint  Laceration Length: 2cm  No Foreign Bodies seen or palpated, no deep structures visualized  Anesthesia: digital block Local anesthetic: lidocaine 1% without epinephrine  Anesthetic total: 2 ml  Irrigation method: syringe Amount of cleaning: copious after betadine wash, scrubbing with 4x4's and saline.   Skin closure: ethilon 4-0  Number of sutures: 5  Technique: simple interupted  Patient tolerance: Patient tolerated the procedure well with no immediate complications.   Medications Ordered in ED Medications  Tdap (BOOSTRIX) injection 0.5 mL (0.5 mLs Intramuscular Given 10/02/18 1638)  povidone-iodine (BETADINE) 10 % external solution (  Given 10/02/18 1640)  lidocaine (PF) (XYLOCAINE) 1 % injection (5 mLs  Given 10/02/18 1640)     Initial Impression / Assessment and Plan / ED Course  I have reviewed the triage vital signs and the nursing notes.  Pertinent labs & imaging results that were available during my care of the patient were reviewed by me and considered in my medical decision making (see chart for details).     Wound care instructions given.  Pt advised to have sutures removed in 10 days,  Return here sooner for any signs of infection including redness, swelling, worse pain or drainage of pus.     Final Clinical Impressions(s) / ED Diagnoses   Final diagnoses:  Laceration of left index finger without foreign body without damage to nail, initial encounter    ED Discharge Orders    None       Victoriano Laindol, Krue Peterka, PA-C 10/02/18 1746    Sabas SousBero, Michael M, MD 10/03/18 [phone]

## 2018-10-02 NOTE — Discharge Instructions (Addendum)
Keep your wound clean and dry.  Have your sutures removed in 10 days.    Get rechecked for any sign of infection (redness,  Swelling,  Increased pain or drainage of purulent fluid).

## 2019-02-11 IMAGING — DX DG HAND COMPLETE 3+V*L*
3 series · 3 of 3 positions shown · non-contrast
Comparison: None.

CLINICAL DATA: Metal object fell on hand

EXAM:
LEFT HAND - COMPLETE 3+ VIEW

[hand pa]
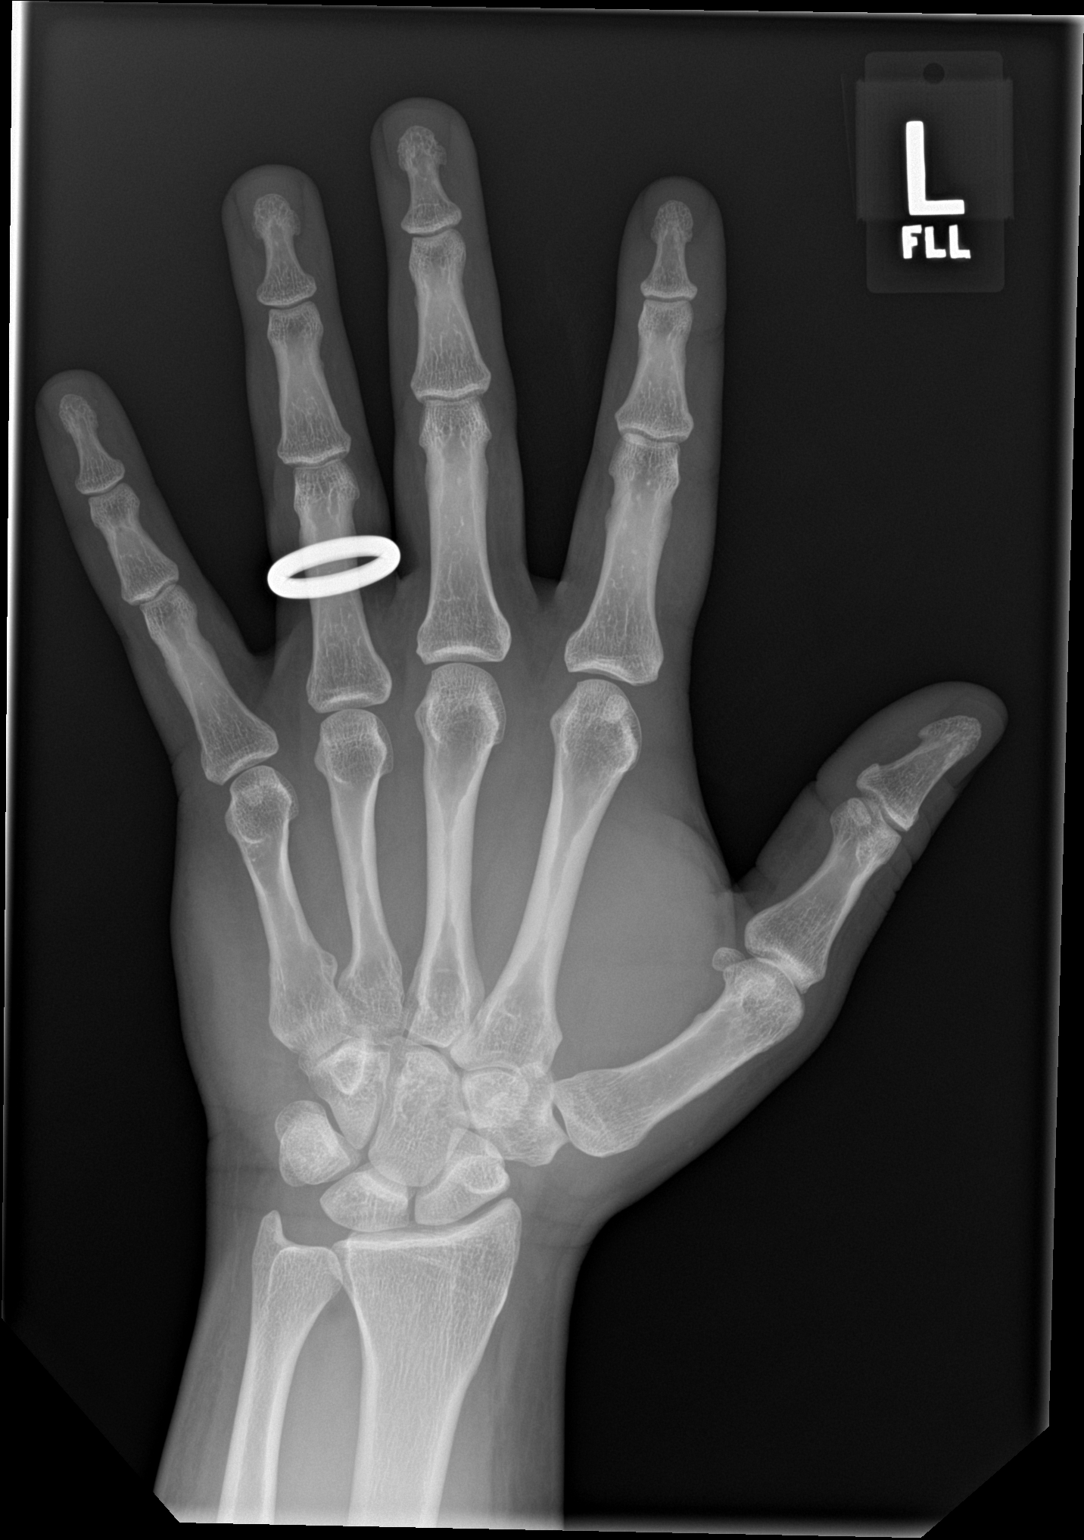

[hand obl]
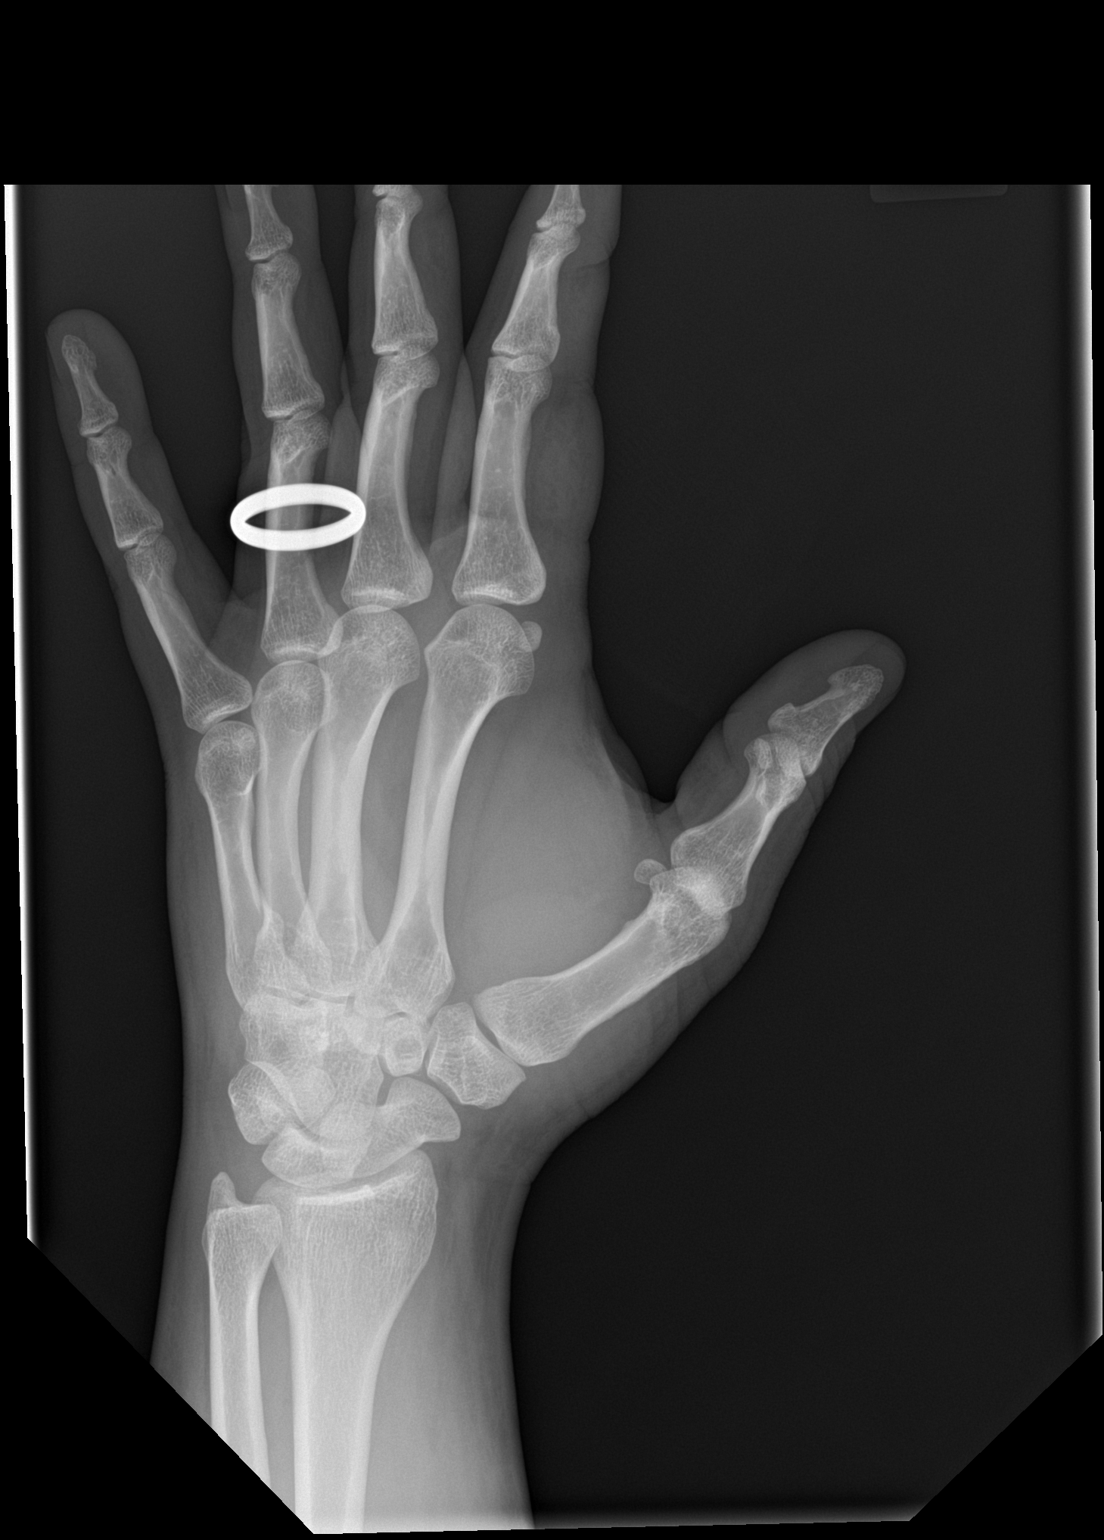

[hand lat]
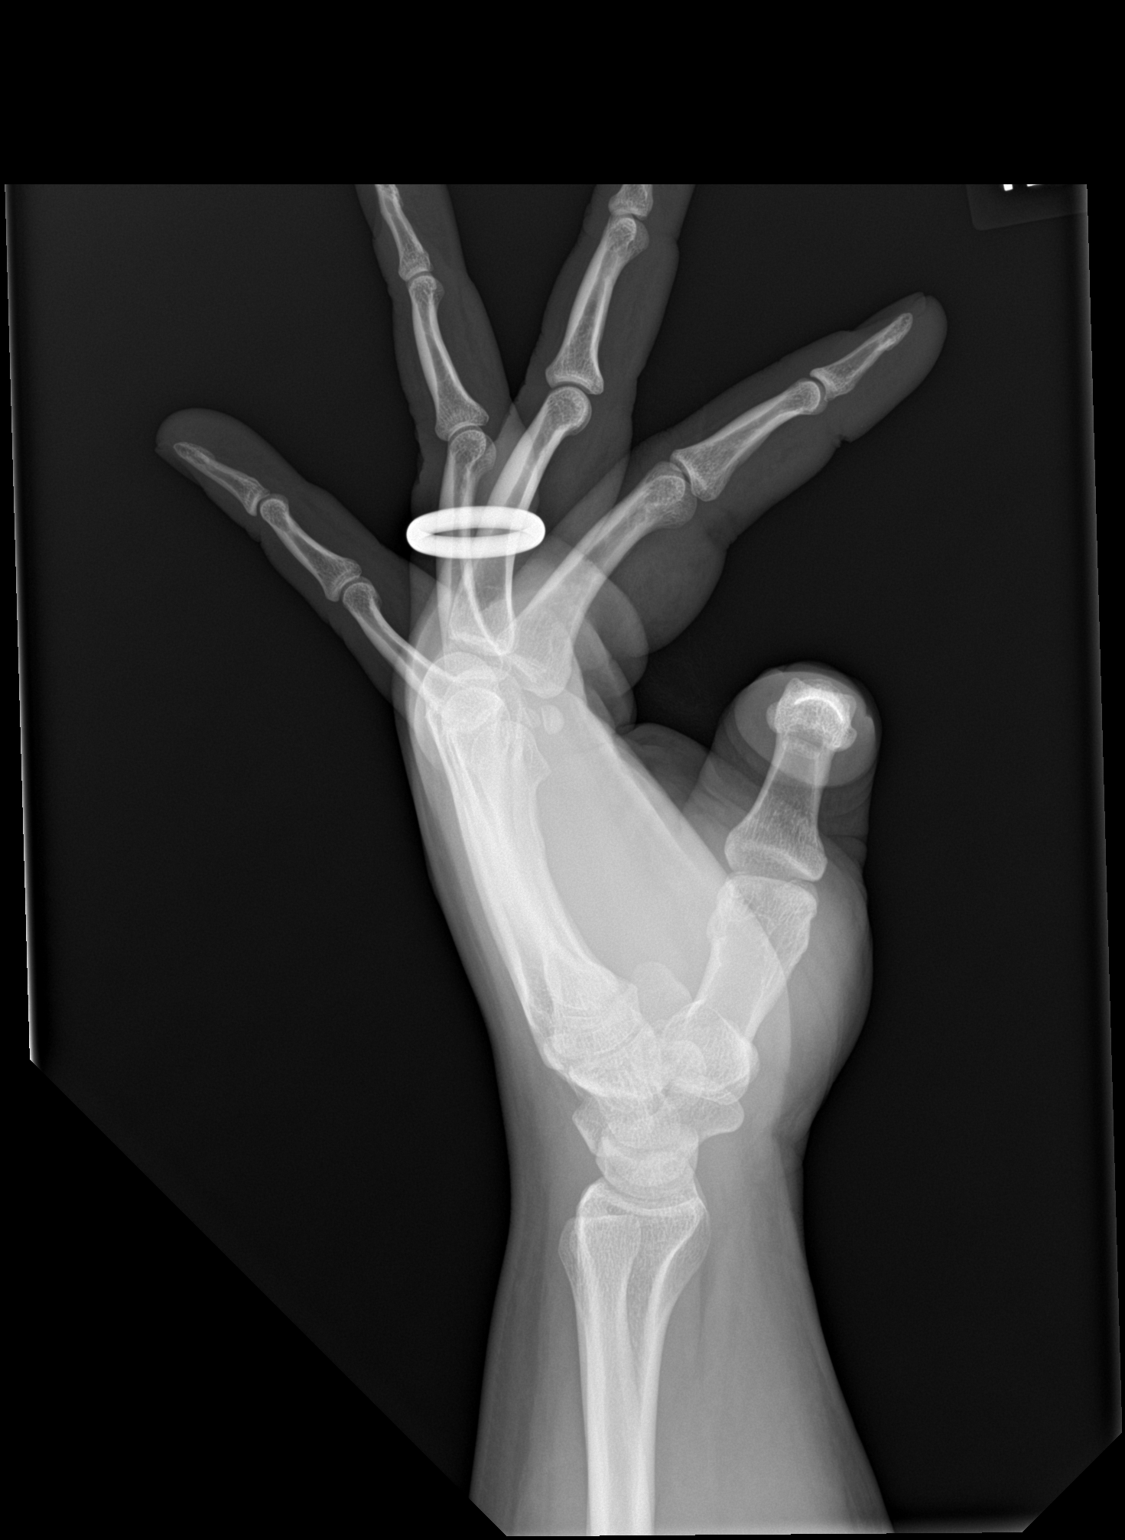

[3 of 3 positions shown; findings below may reference images not displayed]

FINDINGS: Frontal, oblique, and lateral views obtained. No radiopaque foreign
body identified beyond ring overlying proximal fourth phalanx. There
is no appreciable soft tissue air radiographically. There is no
fracture or dislocation. Joint spaces appear normal. No erosive
change.
IMPRESSION: No fracture or dislocation. No appreciable arthropathy. No
radiopaque foreign body beyond ring overlying fourth proximal digit.

## 2019-11-23 ENCOUNTER — Encounter: Payer: Self-pay | Admitting: Family Medicine

## 2022-01-24 ENCOUNTER — Encounter: Payer: Self-pay | Admitting: Family Medicine

## 2022-01-24 ENCOUNTER — Ambulatory Visit (INDEPENDENT_AMBULATORY_CARE_PROVIDER_SITE_OTHER): Payer: BC Managed Care – PPO | Admitting: Family Medicine

## 2022-01-24 DIAGNOSIS — Z0000 Encounter for general adult medical examination without abnormal findings: Secondary | ICD-10-CM | POA: Diagnosis not present

## 2022-01-24 DIAGNOSIS — F4310 Post-traumatic stress disorder, unspecified: Secondary | ICD-10-CM | POA: Insufficient documentation

## 2022-01-24 DIAGNOSIS — G4733 Obstructive sleep apnea (adult) (pediatric): Secondary | ICD-10-CM | POA: Insufficient documentation

## 2022-01-24 DIAGNOSIS — M79A29 Nontraumatic compartment syndrome of unspecified lower extremity: Secondary | ICD-10-CM | POA: Insufficient documentation

## 2022-01-24 NOTE — Progress Notes (Signed)
? ?  Subjective:  ?Patient ID: Joshua Pope, male    DOB: 29-May-1986  Age: 36 y.o. MRN: [id number] ? ?CC: Establish care/physical ? ?HPI ?Joshua Pope is a 36 y.o. male presents to the clinic today to establish care. He is in need of a physical exam. ? ?Patient needs a physical exam regarding work form that needs to be filled out.  He needs a TB screening test as well.  Has had COVID vaccines.  Has had a recent eye exam.  Has had his flu vaccine this year. ? ?[facility]  He is currently on treatment for PTSD. ? ?Patient states that he is doing well at this time.  He has no other issues or concerns. ? ?PMH, Surgical Hx, Family Hx, Social History reviewed and updated as below. ? ?Past Medical History:  ?Diagnosis Date  ? Compartment syndrome (HCC)   ? OSA (obstructive sleep apnea)   ? PTSD (post-traumatic stress disorder)   ? ?Past Surgical History:  ?Procedure Laterality Date  ? NO PAST SURGERIES    ? ?No significant Family Hx per his report. ? ?Social History  ? ?Tobacco Use  ? Smoking status: Never  ? Smokeless tobacco: Never  ?Substance Use Topics  ? Alcohol use: No  ? ? ?Review of Systems  ?Constitutional: Negative.   ?Respiratory: Negative.    ?Cardiovascular: Negative.   ? ?Objective:  ? ?Today's Vitals: BP 130/86   Pulse 82   Ht 5\' 8"  (1.727 m)   Wt (!) 311 lb (141.1 kg)   BMI 47.29 kg/m?  ? ?Physical Exam ?Vitals and nursing note reviewed.  ?Constitutional:   ?   General: He is not in acute distress. ?   Appearance: Normal appearance. He is obese. He is not ill-appearing.  ?HENT:  ?   Head: Normocephalic and atraumatic.  ?   Mouth/Throat:  ?   Pharynx: Oropharynx is clear.  ?Eyes:  ?   General:     ?   Right eye: No discharge.     ?   Left eye: No discharge.  ?   Conjunctiva/sclera: Conjunctivae normal.  ?Cardiovascular:  ?   Rate and Rhythm: Normal rate and regular rhythm.  ?Pulmonary:  ?   Effort: Pulmonary effort is normal.  ?   Breath sounds: Normal breath sounds. No wheezing,  rhonchi or rales.  ?Abdominal:  ?   General: There is no distension.  ?   Palpations: Abdomen is soft.  ?   Tenderness: There is no abdominal tenderness.  ?Neurological:  ?   Mental Status: He is alert.  ?Psychiatric:     ?   Mood and Affect: Mood normal.     ?   Behavior: Behavior normal.  ? ? ? ?Assessment & Plan:  ? ?Problem List Items Addressed This Visit   ? ?  ? Other  ? Annual physical exam  ?  Patient is doing well. ?Form for work has been filled out.  However, he needs a screening TB test.  This cannot be performed until next week due to the upcoming holiday. ?Advised continued exercise and dietary changes to promote weight loss. ?  ?  ? ? DO ?[facility] ? ? ? ? ?

## 2022-01-24 NOTE — Assessment & Plan Note (Signed)
Patient is doing well. ?Form for work has been filled out.  However, he needs a screening TB test.  This cannot be performed until next week due to the upcoming holiday. ?Advised continued exercise and dietary changes to promote weight loss. ?

## 2022-01-24 NOTE — Patient Instructions (Signed)
TB test next week at your convenience. ? ?Follow up annually or sooner if needed. ? ?Take care ? ?Dr. Adriana Simas  ?

## 2022-01-30 ENCOUNTER — Other Ambulatory Visit (INDEPENDENT_AMBULATORY_CARE_PROVIDER_SITE_OTHER): Payer: BC Managed Care – PPO | Admitting: *Deleted

## 2022-01-30 DIAGNOSIS — Z111 Encounter for screening for respiratory tuberculosis: Secondary | ICD-10-CM | POA: Diagnosis not present

## 2022-02-01 LAB — TB SKIN TEST
Induration: 0 mm
TB Skin Test: NEGATIVE
